# Patient Record
Sex: Male | Born: 1990 | Race: Asian | Hispanic: No | Smoking: Never smoker
Health system: Southern US, Community
[De-identification: ages and names within clinical notes are randomized; demographics above are authoritative.]

---

## 2018-09-21 ENCOUNTER — Emergency Department (HOSPITAL_COMMUNITY): Payer: BLUE CROSS/BLUE SHIELD

## 2018-09-21 ENCOUNTER — Emergency Department (HOSPITAL_COMMUNITY)
Admission: EM | Admit: 2018-09-21 | Discharge: 2018-09-21 | Disposition: A | Discharge status: Home / Self Care | Payer: BLUE CROSS/BLUE SHIELD | Attending: Emergency Medicine | Admitting: Emergency Medicine

## 2018-09-21 ENCOUNTER — Encounter (HOSPITAL_COMMUNITY): Payer: Self-pay | Admitting: Emergency Medicine

## 2018-09-21 ENCOUNTER — Other Ambulatory Visit: Payer: Self-pay

## 2018-09-21 DIAGNOSIS — S62663A Nondisplaced fracture of distal phalanx of left middle finger, initial encounter for closed fracture: Secondary | ICD-10-CM | POA: Insufficient documentation

## 2018-09-21 DIAGNOSIS — Y929 Unspecified place or not applicable: Secondary | ICD-10-CM | POA: Insufficient documentation

## 2018-09-21 DIAGNOSIS — S61213A Laceration without foreign body of left middle finger without damage to nail, initial encounter: Secondary | ICD-10-CM

## 2018-09-21 DIAGNOSIS — Y939 Activity, unspecified: Secondary | ICD-10-CM | POA: Diagnosis not present

## 2018-09-21 DIAGNOSIS — Y990 Civilian activity done for income or pay: Secondary | ICD-10-CM | POA: Diagnosis not present

## 2018-09-21 DIAGNOSIS — Z23 Encounter for immunization: Secondary | ICD-10-CM | POA: Diagnosis not present

## 2018-09-21 DIAGNOSIS — W312XXA Contact with powered woodworking and forming machines, initial encounter: Secondary | ICD-10-CM | POA: Insufficient documentation

## 2018-09-21 DIAGNOSIS — S62663B Nondisplaced fracture of distal phalanx of left middle finger, initial encounter for open fracture: Secondary | ICD-10-CM

## 2018-09-21 MED ORDER — TETANUS-DIPHTH-ACELL PERTUSSIS 5-2.5-18.5 LF-MCG/0.5 IM SUSP
0.5000 mL | Freq: Once | INTRAMUSCULAR | Status: AC
  Administered 2018-09-21: 0.5 mL via INTRAMUSCULAR
  Filled 2018-09-21: qty 0.5

## 2018-09-21 MED ORDER — BUPIVACAINE HCL 0.5 % IJ SOLN
50.0000 mL | Freq: Once | INTRAMUSCULAR | Status: AC
  Administered 2018-09-21: 50 mL

## 2018-09-21 MED ORDER — DOXYCYCLINE HYCLATE 100 MG PO TABS
100.0000 mg | ORAL_TABLET | Freq: Once | ORAL | Status: AC
  Administered 2018-09-21: 100 mg via ORAL
  Filled 2018-09-21: qty 1

## 2018-09-21 MED ORDER — DOXYCYCLINE HYCLATE 100 MG PO CAPS: 100.0000 mg | ORAL_CAPSULE | Freq: Two times a day (BID) | ORAL | 0 refills | Status: AC

## 2018-09-21 MED ORDER — LIDOCAINE HCL (PF) 1 % IJ SOLN
INTRAMUSCULAR | Status: AC
  Administered 2018-09-21: 30 mL
  Filled 2018-09-21: qty 30

## 2018-09-21 MED ORDER — LIDOCAINE HCL (CARDIAC) PF 100 MG/5ML IV SOSY
PREFILLED_SYRINGE | INTRAVENOUS | Status: AC
  Filled 2018-09-21: qty 5

## 2018-09-21 MED ORDER — NAPROXEN 500 MG PO TABS: 500.0000 mg | ORAL_TABLET | Freq: Two times a day (BID) | ORAL | 0 refills | Status: AC

## 2018-09-21 NOTE — Discharge Instructions (Signed)
Return in 10 to 12 days for suture removal.  You can return to this ED, in the ED, urgent care or your primary care doctor's office. Return sooner for any signs of infection including redness, redness extending up your arm, pus draining from the area or fever.

## 2018-09-21 NOTE — ED Notes (Signed)
Declined W/C at D/C and was escorted to lobby by RN. 

## 2018-09-21 NOTE — ED Triage Notes (Signed)
Pt reports using a electric saw when it dropped on his left middle finger. Bleeding at triage.

## 2018-09-21 NOTE — ED Provider Notes (Addendum)
[facility] EMERGENCY DEPARTMENT Provider Note   CSN: [id number] Arrival date & time: 09/21/18  1401     History   Chief Complaint Chief Complaint  Patient presents with  . Laceration  . Finger Injury    HPI Joseph Frederick is a 27 y.o. male who presents to ED for evaluation of dominant right third digit laceration that occurred just 15 minutes prior to arrival.  He was at work trying to make a shelf when an electric saw cut his finger.  He has had bleeding since then.  He is unsure of last tetanus.  Denies any anticoagulant use, prior fracture, dislocations or procedures in the area.  HPI  History reviewed. No pertinent past medical history.  There are no active problems to display for this patient.   History reviewed. No pertinent surgical history.      Home Medications    Prior to Admission medications   Medication Sig Start Date End Date Taking? Authorizing Provider  doxycycline (VIBRAMYCIN) 100 MG capsule Take 1 capsule (100 mg total) by mouth 2 (two) times daily. 09/21/18   Agape Hardiman, PA-C  naproxen (NAPROSYN) 500 MG tablet Take 1 tablet (500 mg total) by mouth 2 (two) times daily. 09/21/18   Dietrich Pates, PA-C    Family History No family history on file.  Social History Social History   Tobacco Use  . Smoking status: Never Smoker  . Smokeless tobacco: Never Used  Substance Use Topics  . Alcohol use: Yes  . Drug use: Yes    Types: Marijuana     Allergies   Patient has no known allergies.   Review of Systems Review of Systems  Constitutional: Negative for chills and fever.  Musculoskeletal: Negative for arthralgias.  Skin: Positive for wound.     Physical Exam Updated Vital Signs BP 139/71 (BP Location: Right Arm)   Pulse 85   Temp 98 F (36.7 C) (Oral)   Resp 18   Ht 5\' 8"  (1.727 m)   Wt 72.6 kg   SpO2 99%   BMI 24.33 kg/m   Physical Exam  Constitutional: He appears well-developed and well-nourished. No  distress.  HENT:  Head: Normocephalic and atraumatic.  Eyes: Conjunctivae and EOM are normal. No scleral icterus.  Neck: Normal range of motion.  Pulmonary/Chest: Effort normal. No respiratory distress.  Musculoskeletal:  Sensation intact to light touch of digit. Able to flex and extend at DIP joint.  Neurological: He is alert.  Skin: Laceration noted. No rash noted. He is not diaphoretic.  No tendon involvement noted. Tissue exposed as indicated in the image.  Psychiatric: He has a normal mood and affect.  Nursing note and vitals reviewed.          ED Treatments / Results  Labs (all labs ordered are listed, but only abnormal results are displayed) Labs Reviewed - No data to display  EKG None  Radiology Dg Finger Middle Left  Result Date: 09/21/2018 CLINICAL DATA:  27 y/o  M; laceration to third distal phalanx. EXAM: LEFT MIDDLE FINGER 2+V COMPARISON:  None. FINDINGS: Deep soft tissue laceration of the volar surface of third distal phalanx. Acute mildly displaced fracture of the radial base of the third distal phalanx. No joint dislocation. IMPRESSION: Acute mildly displaced fracture of the radial base of the third distal phalanx. Electronically Signed   By: Mitzi Hansen M.D.   On: 09/21/2018 14:45    Procedures .Marland KitchenLaceration Repair Date/Time: 09/21/2018 4:25 PM Performed by: Dietrich Pates,  PA-C Authorized by: Dietrich Pates, PA-C   Consent:    Consent obtained:  Verbal   Consent given by:  Patient   Risks discussed:  Infection, need for additional repair, nerve damage, pain, poor cosmetic result, retained foreign body, tendon damage, vascular damage and poor wound healing Anesthesia (see MAR for exact dosages):    Anesthesia method:  Local infiltration   Local anesthetic:  Lidocaine 1% w/o epi and bupivacaine 0.5% w/o epi Laceration details:    Location:  Finger   Finger location:  L long finger   Length (cm):  7 (see image) Repair type:    Repair type:   Simple Exploration:    Hemostasis achieved with:  Direct pressure   Wound exploration: wound explored through full range of motion     Wound extent: no tendon damage noted   Treatment:    Area cleansed with:  Saline   Amount of cleaning:  Extensive   Irrigation solution:  Sterile saline   Irrigation method:  Pressure wash and syringe Skin repair:    Repair method:  Sutures   Suture size:  4-0   Suture material:  Prolene   Suture technique:  Simple interrupted   Number of sutures:  15 Approximation:    Approximation:  Close Post-procedure details:    Dressing:  Antibiotic ointment   Patient tolerance of procedure:  Tolerated well, no immediate complications   (including critical care time)  Medications Ordered in ED Medications  lidocaine (PF) (XYLOCAINE) 1 % injection (30 mLs  Given 09/21/18 1507)  Tdap (BOOSTRIX) injection 0.5 mL (0.5 mLs Intramuscular Given 09/21/18 1507)  bupivacaine (MARCAINE) 0.5 % (with pres) injection 50 mL (50 mLs Infiltration Given 09/21/18 1509)  doxycycline (VIBRA-TABS) tablet 100 mg (100 mg Oral Given 09/21/18 1551)     Initial Impression / Assessment and Plan / ED Course  I have reviewed the triage vital signs and the nursing notes.  Pertinent labs & imaging results that were available during my care of the patient were reviewed by me and considered in my medical decision making (see chart for details).     27 year old male presents for finger laceration that occurred prior to arrival.  He accidentally cut himself with an electric saw.  He is unsure of last tetanus.  Physical exam findings noted in the image above.  Able to flex and extend digit at the DIP joint without difficulty.  X-ray shows mildly displaced fracture of the radial base of the third distal phalanx.  Spoke to Charma Igo, orthopedic PA who recommends antibiotics, closure in the ED.  The wound was explored with no tendon involvement noted. Patient counseled on wound care.  Patient counseled on need to return or see PCP/urgent care for suture removal in 10-12 days. Patient was urged to return to the Emergency Department urgently with worsening pain, swelling, expanding erythema especially if it streaks away from the affected area, fever, or if they have any other concerns. Patient verbalized understanding.   Portions of this note were generated with Scientist, clinical (histocompatibility and immunogenetics). Dictation errors may occur despite best attempts at proofreading.  Final Clinical Impressions(s) / ED Diagnoses   Final diagnoses:  Laceration of left middle finger without foreign body without damage to nail, initial encounter  Open nondisplaced fracture of distal phalanx of left middle finger, initial encounter    ED Discharge Orders         Ordered    doxycycline (VIBRAMYCIN) 100 MG capsule  2 times daily  09/21/18 1624    naproxen (NAPROSYN) 500 MG tablet  2 times daily     09/21/18 1624           Dietrich Pates, PA-C 09/21/18 1629    Tilden Fossa, MD 09/22/18 0800    Dietrich Pates, PA-C 10/04/18 1610    Tilden Fossa, MD 10/04/18 1153

## 2020-03-04 IMAGING — DX DG FINGER MIDDLE 2+V*L*
3 series · 3 of 3 positions shown · non-contrast
Comparison: None.

CLINICAL DATA: 27 y/o  M; laceration to third distal phalanx.

EXAM:
LEFT MIDDLE FINGER 2+V

[finger ap]
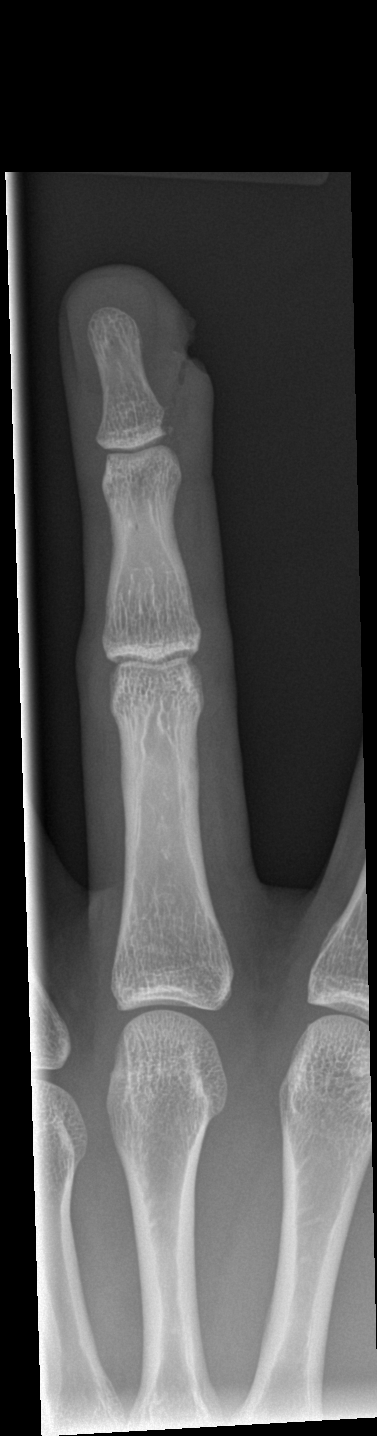

[finger obl]
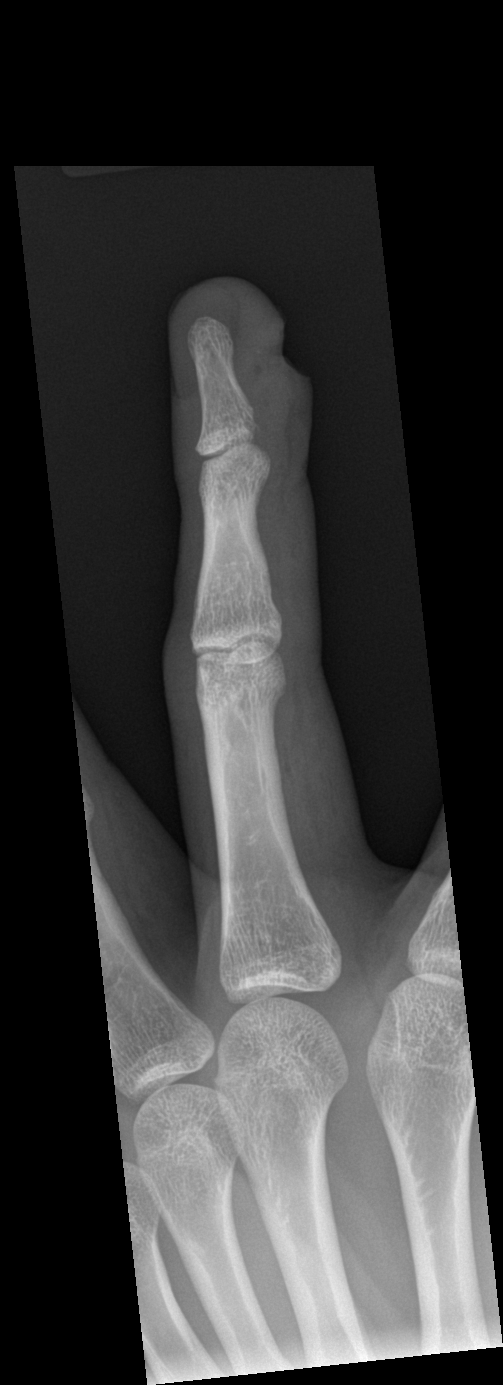

[finger lat]
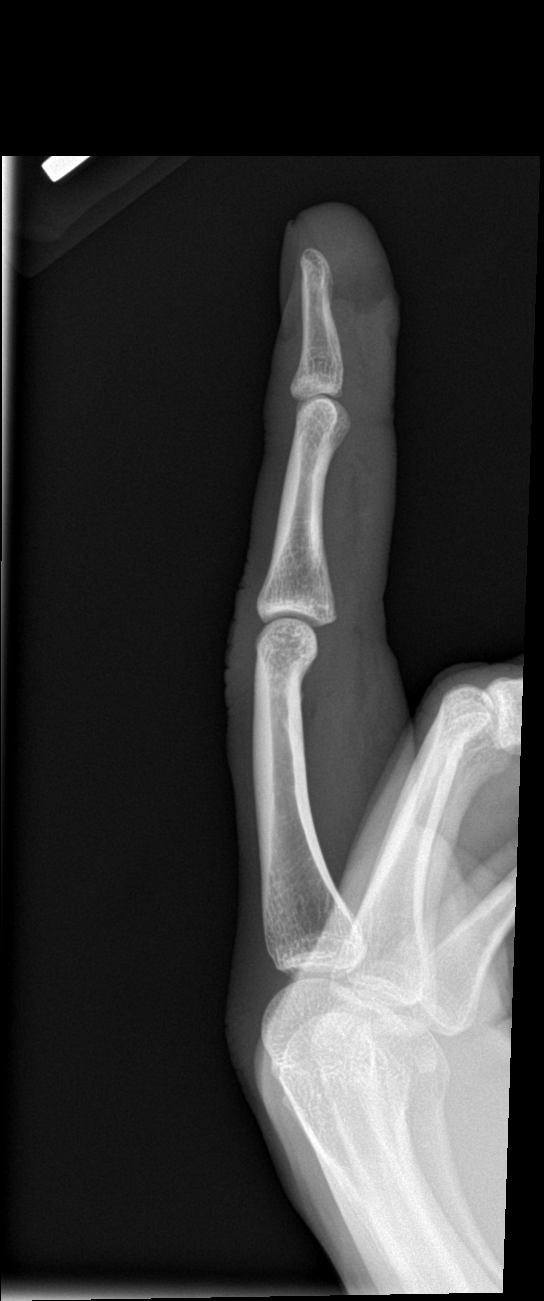

[3 of 3 positions shown; findings below may reference images not displayed]

FINDINGS: Deep soft tissue laceration of the volar surface of third distal
phalanx. Acute mildly displaced fracture of the radial base of the
third distal phalanx. No joint dislocation.
IMPRESSION: Acute mildly displaced fracture of the radial base of the third
distal phalanx.

By: Arnar Bill Tynes M.D.

## 2021-05-30 ENCOUNTER — Ambulatory Visit: Payer: Self-pay | Admitting: Surgery

## 2021-05-30 ENCOUNTER — Ambulatory Visit: Payer: Self-pay

## 2021-05-30 ENCOUNTER — Encounter: Payer: Self-pay | Admitting: Surgery

## 2021-05-30 VITALS — BP 133/77 | HR 84 | Ht 68.0 in | Wt 160.0 lb

## 2021-05-30 DIAGNOSIS — M545 Low back pain, unspecified: Secondary | ICD-10-CM

## 2021-05-30 MED ORDER — METHYLPREDNISOLONE ACETATE 40 MG/ML IJ SUSP: 40.0000 mg | Freq: Once | INTRAMUSCULAR | Status: AC

## 2021-05-30 MED ORDER — KETOROLAC TROMETHAMINE 30 MG/ML IJ SOLN: 30.0000 mg | Freq: Once | INTRAMUSCULAR | Status: AC

## 2021-05-30 MED ORDER — METHOCARBAMOL 500 MG PO TABS: 500.0000 mg | ORAL_TABLET | Freq: Three times a day (TID) | ORAL | 0 refills | Status: AC | PRN

## 2021-05-30 MED ORDER — METHYLPREDNISOLONE 4 MG PO TABS: ORAL_TABLET | ORAL | 0 refills | Status: AC

## 2021-05-30 NOTE — Progress Notes (Signed)
Office Visit Note   Patient: Joseph Frederick           Date of Birth: 04-05-1991           MRN: [id number] Visit Date: 05/30/2021              Requested by: No referring provider defined for this encounter. PCP: Patient, No Pcp Per (Inactive)   Assessment & Plan: Visit Diagnoses:  1. Acute low back pain without sciatica, unspecified back pain laterality     Plan: With pain the patient is describing today I did blood work to check a CBC and arthritis panel.  Patient does not have a primary care provider and I also added on a c-Met.  Today in the clinic he was also given a Depo-Medrol 40 mg and Toradol 30 mg IM injections to see if this bring down his pain level a little quicker.  I sent in prescriptions for Medrol Dosepak 6-day taper to be taken as directed and Robaxin for spasms.  Given a note keeping him out of work x1 week.  He will follow-up with me in 1 week for recheck.  If he continues have ongoing symptoms and has not had any improvement with treatment we will plan to get a lumbar MRI to rule out HNP/stenosis.  I advised patient that if his symptoms worsen over the long weekend that he needs to go directly to the emergency room for evaluation.  He voiced understanding.  All questions answered.  Follow-Up Instructions: Return in about 1 week (around 06/06/2021) for with Kasai Beltran recheck back pain.   Orders:  Orders Placed This Encounter  Procedures   XR Lumbar Spine 2-3 Views   CBC   Sed Rate (ESR)   Rheumatoid Factor   Uric acid   Antinuclear Antib (ANA)   Comprehensive Metabolic Panel (CMET)   Meds ordered this encounter  Medications   methylPREDNISolone acetate (DEPO-MEDROL) injection 40 mg   ketorolac (TORADOL) 30 MG/ML injection 30 mg   methylPREDNISolone (MEDROL) 4 MG tablet    Sig: 6-day taper to be taken as directed    Dispense:  21 tablet    Refill:  0   methocarbamol (ROBAXIN) 500 MG tablet    Sig: Take 1 tablet (500 mg total) by mouth every 8 (eight) hours as  needed for muscle spasms.    Dispense:  50 tablet    Refill:  0      Procedures: No procedures performed   Clinical Data: No additional findings.   Subjective: Chief Complaint  Patient presents with   Lower Back - Pain    HPI 30 year old Asian male who is new patient to clinic comes in with complaints of acute on chronic low back pain.  States that he has had back pain off and on for 10 years.  He is only been seen by chiropractor and most recently was about 8 months ago.  States that treatments would help temporarily.  He states that he has back pain all the time but occasionally he has severe flares and his most recent one was about a week ago.  No specific injury that he can recall.  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  His pain has been so bad that he had to take out of work the last week.  Aggravated with walking, sitting bending, twisting.  He cannot stand straight up.  States that with his flare 8 months  ago he did have some pain radiating down his legs but nothing since then.  Denies leg weakness.  Denies dysuria, hematuria.  No complaints of bowel or bladder incontinence.  Patient has not established with a primary care provider.  States that his dad has diabetes.  He is unsure of personal or family history of inflammatory arthropathy.   Review of Systems No current cardiac pulmonary GI GU issues.  Objective: Vital Signs: BP 133/77   Pulse 84   Ht 5' 8"  (1.727 m)   Wt 160 lb (72.6 kg)   BMI 24.33 kg/m   Physical Exam Constitutional:      Comments: Patient does appear t to have discomfort when getting up from a seated position.  HENT:     Head: Normocephalic and atraumatic.  Eyes:     Extraocular Movements: Extraocular movements intact.     Pupils: Pupils are equal, round, and reactive to light.  Pulmonary:     Effort: No respiratory distress.  Musculoskeletal:     Comments: Gait is antalgic  but he does not walk in a way where I would be concerned about leg weakness..  Patient has moderate to marked lumbar spasm/tenderness.  He has marked discomfort with attempting to stand fully erect and more so with attempted lumbar extension.  Forward flexion hands to thighs with pain.  Positive sciatic notch tenderness.  Negative logroll bilateral hips.  Straight leg raise bilaterally does cause pain in the low back.  He has tight hamstrings bilaterally.  With bilateral quad, anterior tib and gastroc resistance testing it feels like he has trace weakness but he also has pain with these maneuvers so was difficult to tell if this is true weakness.    Neurological:     Mental Status: He is alert and oriented to person, place, and time.  Psychiatric:        Mood and Affect: Mood normal.    Ortho Exam  Specialty Comments:  No specialty comments available.  Imaging: No results found.   PMFS History: There are no problems to display for this patient.  History reviewed. No pertinent past medical history.  History reviewed. No pertinent family history.  History reviewed. No pertinent surgical history. Social History   Occupational History   Not on file  Tobacco Use   Smoking status: Never   Smokeless tobacco: Never  Substance and Sexual Activity   Alcohol use: Yes   Drug use: Yes    Types: Marijuana   Sexual activity: Not on file

## 2021-05-30 NOTE — Progress Notes (Signed)
Per Zonia Kief, PA patient was given an IM injection of Toradol 30mg  in the right glute and an IM injection of Depo 40mg  in the left glute.  Patient tolerated both IM injections very well.

## 2021-05-31 ENCOUNTER — Encounter: Payer: Self-pay | Admitting: Surgery

## 2021-06-04 LAB — COMPREHENSIVE METABOLIC PANEL
AG Ratio: 2 (calc) (ref 1.0–2.5)
ALT: 21 U/L (ref 9–46)
AST: 22 U/L (ref 10–40)
Albumin: 4.5 g/dL (ref 3.6–5.1)
Alkaline phosphatase (APISO): 71 U/L (ref 36–130)
BUN: 14 mg/dL (ref 7–25)
CO2: 25 mmol/L (ref 20–32)
Calcium: 9.4 mg/dL (ref 8.6–10.3)
Chloride: 103 mmol/L (ref 98–110)
Creat: 0.9 mg/dL (ref 0.60–1.35)
Globulin: 2.3 g/dL (calc) (ref 1.9–3.7)
Glucose, Bld: 83 mg/dL (ref 65–99)
Potassium: 4.3 mmol/L (ref 3.5–5.3)
Sodium: 140 mmol/L (ref 135–146)
Total Bilirubin: 0.6 mg/dL (ref 0.2–1.2)
Total Protein: 6.8 g/dL (ref 6.1–8.1)

## 2021-06-04 LAB — CBC
HCT: 47.5 % (ref 38.5–50.0)
Hemoglobin: 15.6 g/dL (ref 13.2–17.1)
MCH: 28.8 pg (ref 27.0–33.0)
MCHC: 32.8 g/dL (ref 32.0–36.0)
MCV: 87.8 fL (ref 80.0–100.0)
MPV: 10.5 fL (ref 7.5–12.5)
Platelets: 192 10*3/uL (ref 140–400)
RBC: 5.41 10*6/uL (ref 4.20–5.80)
RDW: 13 % (ref 11.0–15.0)
WBC: 6.2 10*3/uL (ref 3.8–10.8)

## 2021-06-04 LAB — ANTI-NUCLEAR AB-TITER (ANA TITER): ANA Titer 1: 1:80 {titer} — ABNORMAL HIGH

## 2021-06-04 LAB — ANA: Anti Nuclear Antibody (ANA): POSITIVE — AB

## 2021-06-04 LAB — RHEUMATOID FACTOR: Rhuematoid fact SerPl-aCnc: <14 IU/mL (ref <14)

## 2021-06-04 LAB — URIC ACID: Uric Acid, Serum: 5.8 mg/dL (ref 4.0–8.0)

## 2021-06-04 LAB — SEDIMENTATION RATE: Sed Rate: 2 mm/h (ref 0–15)

## 2021-06-06 ENCOUNTER — Encounter: Payer: Self-pay | Admitting: Surgery

## 2021-06-06 ENCOUNTER — Ambulatory Visit (INDEPENDENT_AMBULATORY_CARE_PROVIDER_SITE_OTHER): Payer: Self-pay | Admitting: Surgery

## 2021-06-06 ENCOUNTER — Telehealth: Payer: Self-pay | Admitting: Surgery

## 2021-06-06 VITALS — BP 104/67 | HR 75 | Ht 68.0 in | Wt 160.0 lb

## 2021-06-06 DIAGNOSIS — M5416 Radiculopathy, lumbar region: Secondary | ICD-10-CM

## 2021-06-06 NOTE — Progress Notes (Signed)
   Office Visit Note   Joseph Frederick: Joseph Frederick           Date of Birth: 08-Sep-1991           MRN: [id number] Visit Date: 06/06/2021              Requested by: No referring provider defined for this encounter. PCP: Joseph Frederick, No Pcp Per (Inactive)   Assessment & Plan: Visit Diagnoses:  1. Radiculopathy, lumbar region     Plan: Since Joseph Frederick continues to be symptomatic I recommend getting a lumbar MRI to rule out HNP/stenosis.  Joseph Frederick will follow up with me after completion of the study to discuss results.  Last office visit I also did blood work to check a CBC and arthritis panel.  He did have a positive ANA titer.  I did not review the labs until Joseph Frederick had left the clinic and I will discuss this when he returns.  I will also plan to refer Joseph Frederick to rheumatology.  Follow-Up Instructions: Return in about 3 weeks (around 06/27/2021) for with dr yates to review lumbar mri scan.   Orders:  Orders Placed This Encounter  Procedures   MR Lumbar Spine w/o contrast   No orders of the defined types were placed in this encounter.     Procedures: No procedures performed   Clinical Data: No additional findings.   Subjective: Chief Complaint  Joseph Frederick presents with   Lower Back - Follow-up    HPI 30 year old Asian male returns for recheck of his low back pain.  States that he has had some improvement of his spasms after the injections and prednisone taper.  He does still continue to have pain in the low back with extension into bilateral hips and thighs.     Objective: Vital Signs: BP 104/67   Pulse 75   Ht 5\' 8"  (1.727 m)   Wt 160 lb (72.6 kg)   BMI 24.33 kg/m   Physical Exam Very pleasant male alert and oriented in no acute distress.  Gait is still antalgic.  He can stand more erect but still has pain across the lumbar paraspinal muscles.  The lumbar spasms are greatly improved but still tender.  Positive sciatic notch tenderness.  Negative logroll.  Positive bilateral  straight leg raise.  Joseph Frederick does continue to have tight hamstrings bilaterally.  No focal motor deficits. Ortho Exam  Specialty Comments:  No specialty comments available.  Imaging: No results found.   PMFS History: There are no problems to display for this Joseph Frederick.  History reviewed. No pertinent past medical history.  History reviewed. No pertinent family history.  History reviewed. No pertinent surgical history. Social History   Occupational History   Not on file  Tobacco Use   Smoking status: Never   Smokeless tobacco: Never  Substance and Sexual Activity   Alcohol use: Yes   Drug use: Yes    Types: Marijuana   Sexual activity: Not on file

## 2021-06-06 NOTE — Telephone Encounter (Signed)
Pt called stating he is supposed to be getting set up for an MRI but he would like the referral sent to [facility]. Pt would like a CB when this has been sent please.   [phone]

## 2021-06-10 NOTE — Telephone Encounter (Signed)
Order has been faxed to [facility] orth[facility], they will contact pt to schedule

## 2021-06-25 ENCOUNTER — Ambulatory Visit (INDEPENDENT_AMBULATORY_CARE_PROVIDER_SITE_OTHER): Payer: Self-pay | Admitting: Orthopaedic Surgery

## 2021-06-25 ENCOUNTER — Other Ambulatory Visit: Payer: Self-pay

## 2021-06-25 DIAGNOSIS — M5116 Intervertebral disc disorders with radiculopathy, lumbar region: Secondary | ICD-10-CM

## 2021-06-25 NOTE — Progress Notes (Signed)
Office Visit Note   Patient: Joseph Frederick           Date of Birth: [birth year]           MRN: [id number] Visit Date: 06/25/2021              Requested by: No referring provider defined for this encounter. PCP: Patient, No Pcp Per (Inactive)   Assessment & Plan: Visit Diagnoses:  1. Lumbar disc herniation with radiculopathy     Plan: We discussed options with the patient including therapy, epidural, microdiscectomy and removal of free fragment.  He states he looked at options such as selling one of his cars and has to consider pain for his options he is also looking into insurance options.  He states he wants to wait and see what he can arrange and will call us back and let us know how he like to proceed.  MRI scan is reviewed again copy of the report.  Pathophysiology discussed.  Follow-Up Instructions: No follow-ups on file.   Orders:  No orders of the defined types were placed in this encounter.  No orders of the defined types were placed in this encounter.     Procedures: No procedures performed   Clinical Data: No additional findings.   Subjective: Chief Complaint  Patient presents with   Lower Back - Pain, Follow-up    MRI lumbar review    HPI 30 year old male with chronic back and left leg pain times greater than 5 years.  He has had Depo-Medrol injections, Toradol, normal rheumatologic lab work-up.  He states in May he went several weeks where he cannot walk more than just getting from the bed to the bathroom with severe back pain leg weakness.  He states he still has pain and pressure in his left leg it radiates down to the dorsum of his foot.  Notices right leg pain with pushing on the gas pedal.  He has had some trouble but can still can walk up stairs.  He has not fallen.  No bowel bladder symptoms.  MRI scan shows disc extrusion at L4-5 on the left paracentral with moderate central stenosis and severe lateral recess stenosis from the extruded  fragment.  Review of Systems all other systems are noncontributory to HPI.   Objective: Vital Signs: Ht 5\' 7"  (1.702 m)   Wt 160 lb (72.6 kg)   BMI 25.06 kg/m   Physical Exam Constitutional:      Appearance: He is well-developed.  HENT:     Head: Normocephalic and atraumatic.     Right Ear: External ear normal.     Left Ear: External ear normal.  Eyes:     Pupils: Pupils are equal, round, and reactive to light.  Neck:     Thyroid: No thyromegaly.     Trachea: No tracheal deviation.  Cardiovascular:     Rate and Rhythm: Normal rate.  Pulmonary:     Effort: Pulmonary effort is normal.     Breath sounds: No wheezing.  Abdominal:     General: Bowel sounds are normal.     Palpations: Abdomen is soft.  Musculoskeletal:     Cervical back: Neck supple.  Skin:    General: Skin is warm and dry.     Capillary Refill: Capillary refill takes less than 2 seconds.  Neurological:     Mental Status: He is alert and oriented to person, place, and time.  Psychiatric:        Behavior: Behavior  normal.        Thought Content: Thought content normal.        Judgment: Judgment normal.    Ortho Exam patient has left positive straight leg raising at 70 degrees on the left negative at 90 on the right.  He is able heel and toe walk.  There is trace anterior tib and EHL weakness on the left.  Knee jerk is 2+ ankle jerk is trace on the left 2+ on the right.  Pedal pulses are normal.  Specialty Comments:  No specialty comments available.  Imaging: CLINICAL DATA: Initial evaluation for acute low back pain for 3-4 weeks.  EXAM: MRI LUMBAR SPINE WITHOUT CONTRAST  TECHNIQUE: Multiplanar, multisequence MR imaging of the lumbar spine was performed. No intravenous contrast was administered.  COMPARISON: None available.  FINDINGS: Segmentation: Standard. Lowest well-formed disc space labeled the L5-S1 level.  Alignment: Straightening of the normal lumbar lordosis.  No listhesis.  Vertebrae: Vertebral body height maintained without acute or chronic fracture. Bone marrow signal intensity within normal limits. No discrete or worrisome osseous lesions. No abnormal marrow edema.  Conus medullaris and cauda equina: Conus extends to the T12 level. Conus and cauda equina appear normal.  Paraspinal and other soft tissues: Duplicated IVC noted. Paraspinous soft tissues otherwise unremarkable.  Disc levels:  L1-2: Degenerative intervertebral disc space narrowing with disc desiccation and diffuse disc bulge. Superimposed small left foraminal to extraforaminal disc protrusion with slight superior migration (series 4, image 10). Additional tiny left subarticular disc extrusion with superior migration noted as well (series 6, image 8). No significant spinal stenosis. Foramina remain patent.  L2-3: Unremarkable.  L3-4: Normal interspace. Mild facet hypertrophy. No significant canal or foraminal stenosis.  L4-5: Degenerative intervertebral disc space narrowing with disc desiccation and diffuse disc bulge. Disc bulging eccentric to the left. Left subarticular disc extrusion with inferior migration (series 3, image 8). Disc material impinges upon the descending left L5 nerve root in the left lateral recess. Superimposed mild facet hypertrophy. Resultant moderate canal and right lateral recess narrowing, with more severe left lateral recess stenosis. Mild bilateral L4 foraminal narrowing, slightly worse on the left.  L5-S1: Degenerative intervertebral disc space narrowing with disc desiccation and diffuse disc bulge. Superimposed small right subarticular disc extrusion with inferior migration (series 3, image 6). Disc material impinges upon the descending right S1 nerve root in the right lateral recess. Mild to moderate right lateral recess stenosis. Central canal remains patent. Mild bilateral 5 foraminal narrowing.  IMPRESSION: 1. Left subarticular  disc extrusion with inferior migration at L4-5, impinging upon the descending left L5 nerve root in the left lateral recess. 2. Small right subarticular disc extrusion with inferior migration at L5-S1, impinging upon the descending right S1 nerve root. 3. Small left foraminal to extraforaminal disc protrusion at L1-2 without significant stenosis or impingement.   Electronically Signed By: Rise Mu M.D. On: 06/15/2021 05:04   PMFS History: Patient Active Problem List   Diagnosis Date Noted   Lumbar disc herniation with radiculopathy 06/26/2021   No past medical history on file.  No family history on file.  No past surgical history on file. Social History   Occupational History   Not on file  Tobacco Use   Smoking status: Never   Smokeless tobacco: Never  Substance and Sexual Activity   Alcohol use: Yes   Drug use: Yes    Types: Marijuana   Sexual activity: Not on file

## 2021-06-26 DIAGNOSIS — M5116 Intervertebral disc disorders with radiculopathy, lumbar region: Secondary | ICD-10-CM | POA: Insufficient documentation

## 2022-01-16 ENCOUNTER — Ambulatory Visit: Payer: Self-pay | Admitting: Surgery

## 2022-01-22 ENCOUNTER — Ambulatory Visit: Payer: Self-pay | Admitting: Orthopaedic Surgery
